# Patient Record
Sex: Male | Born: 1965 | Race: Black or African American | Hispanic: No | Marital: Single | Smoking: Never smoker
Health system: Southern US, Community
[De-identification: ages and names within clinical notes are randomized; demographics above are authoritative.]

---

## 2013-05-29 ENCOUNTER — Emergency Department (HOSPITAL_COMMUNITY)
Admission: EM | Admit: 2013-05-29 | Discharge: 2013-05-30 | Disposition: A | Discharge status: Home / Self Care | Payer: No Typology Code available for payment source | Attending: Emergency Medicine | Admitting: Emergency Medicine

## 2013-05-29 ENCOUNTER — Emergency Department (HOSPITAL_COMMUNITY): Payer: No Typology Code available for payment source

## 2013-05-29 ENCOUNTER — Encounter (HOSPITAL_COMMUNITY): Payer: Self-pay | Admitting: Emergency Medicine

## 2013-05-29 DIAGNOSIS — Y9389 Activity, other specified: Secondary | ICD-10-CM | POA: Insufficient documentation

## 2013-05-29 DIAGNOSIS — S61432A Puncture wound without foreign body of left hand, initial encounter: Secondary | ICD-10-CM

## 2013-05-29 DIAGNOSIS — Y92009 Unspecified place in unspecified non-institutional (private) residence as the place of occurrence of the external cause: Secondary | ICD-10-CM | POA: Insufficient documentation

## 2013-05-29 DIAGNOSIS — S61409A Unspecified open wound of unspecified hand, initial encounter: Secondary | ICD-10-CM | POA: Insufficient documentation

## 2013-05-29 DIAGNOSIS — W3400XA Accidental discharge from unspecified firearms or gun, initial encounter: Secondary | ICD-10-CM

## 2013-05-29 DIAGNOSIS — Z23 Encounter for immunization: Secondary | ICD-10-CM | POA: Insufficient documentation

## 2013-05-29 DIAGNOSIS — W320XXA Accidental handgun discharge, initial encounter: Secondary | ICD-10-CM | POA: Insufficient documentation

## 2013-05-29 MED ORDER — TETANUS-DIPHTH-ACELL PERTUSSIS 5-2.5-18.5 LF-MCG/0.5 IM SUSP
0.5000 mL | Freq: Once | INTRAMUSCULAR | Status: AC
  Administered 2013-05-29: 0.5 mL via INTRAMUSCULAR
  Filled 2013-05-29: qty 0.5

## 2013-05-29 MED ORDER — OXYCODONE-ACETAMINOPHEN 5-325 MG PO TABS
1.0000 | ORAL_TABLET | Freq: Once | ORAL | Status: AC
  Administered 2013-05-29: 1 via ORAL
  Filled 2013-05-29: qty 1

## 2013-05-29 NOTE — ED Notes (Signed)
PA at bedside.
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
## 2013-05-29 NOTE — ED Notes (Signed)
[employer] at bedside 
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
## 2013-05-30 MED ORDER — AMOXICILLIN-POT CLAVULANATE 875-125 MG PO TABS: 1.0000 | ORAL_TABLET | Freq: Two times a day (BID) | ORAL | Status: DC

## 2013-05-30 MED ORDER — AMOXICILLIN-POT CLAVULANATE 875-125 MG PO TABS
1.0000 | ORAL_TABLET | Freq: Once | ORAL | Status: AC
  Administered 2013-05-30: 1 via ORAL
  Filled 2013-05-30: qty 1

## 2013-05-30 MED ORDER — OXYCODONE-ACETAMINOPHEN 5-325 MG PO TABS: 1.0000 | ORAL_TABLET | ORAL | Status: DC | PRN
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
## 2013-05-30 NOTE — ED Notes (Signed)
Pt calling a ride home

## 2013-05-30 NOTE — ED Provider Notes (Signed)
Medical screening examination/treatment/procedure(s) were performed by non-physician practitioner and as supervising physician I was immediately available for consultation/collaboration.     Dorman Calderwood, MD 05/30/13 0611 

## 2014-08-05 ENCOUNTER — Ambulatory Visit: Payer: No Typology Code available for payment source | Admitting: Podiatry
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
## 2015-09-15 ENCOUNTER — Ambulatory Visit (INDEPENDENT_AMBULATORY_CARE_PROVIDER_SITE_OTHER): Payer: No Typology Code available for payment source | Admitting: Podiatry

## 2015-09-15 ENCOUNTER — Encounter: Payer: Self-pay | Admitting: Podiatry

## 2015-09-15 ENCOUNTER — Ambulatory Visit (INDEPENDENT_AMBULATORY_CARE_PROVIDER_SITE_OTHER): Payer: No Typology Code available for payment source

## 2015-09-15 VITALS — BP 125/82 | HR 84 | Resp 16 | Ht 74.0 in | Wt 245.0 lb

## 2015-09-15 DIAGNOSIS — B079 Viral wart, unspecified: Secondary | ICD-10-CM

## 2015-09-15 DIAGNOSIS — M2040 Other hammer toe(s) (acquired), unspecified foot: Secondary | ICD-10-CM

## 2015-09-15 DIAGNOSIS — M778 Other enthesopathies, not elsewhere classified: Secondary | ICD-10-CM

## 2015-09-15 DIAGNOSIS — Q6650 Congenital pes planus, unspecified foot: Secondary | ICD-10-CM

## 2015-09-15 DIAGNOSIS — M779 Enthesopathy, unspecified: Secondary | ICD-10-CM

## 2015-09-15 DIAGNOSIS — M7750 Other enthesopathy of unspecified foot: Secondary | ICD-10-CM

## 2015-09-15 DIAGNOSIS — Q828 Other specified congenital malformations of skin: Secondary | ICD-10-CM

## 2015-09-15 DIAGNOSIS — B353 Tinea pedis: Secondary | ICD-10-CM

## 2015-09-15 NOTE — Progress Notes (Signed)
   Subjective:    Patient ID: Jeremiah Gay, male    DOB: [birth year], 50 y.o.   MRN: [id number]  HPI: He presents today as a 50 year old male with a chief complaint of bilateral foot pain. He states that his feet have been hurting him for very long time and he develops painful calluses or possibly warts to the forefoot bilaterally. He states that he's been seen by a local podiatrist in the past who helped alleviate his symptoms at one time. He states that he has some burning in his feet particularly in the forefoot and dry scaly skin. He denies trauma.    Review of Systems  All other systems reviewed and are negative.      Objective:   Physical Exam: I have reviewed his past medical history medications allergy surgery social history and review of systems. He presents with a dry affect but in no apparent distress. Pulses are strongly palpable bilateral. Neurologic sensorium is intact per Semmes-Weinstein monofilament and vibratory sensation. Deep tendon reflexes are intact bilateral and muscle strength is intact bilateral. 5/5 strength in his dorsiflexors plantar flexors and inverters and everters. All intrinsic musculature appears to be intact digits are in good alignment with mild hammertoe deformities which appears to be flexible in nature. Orthopedic evaluation also demonstrates mild gastroc equinus bilateral with flexible pes planus. He has no reproducible pain other than pain beneath the second metatarsophalangeal joints bilaterally. Cutaneous evaluation demonstrates supple well-hydrated cutis reactive hyperkeratoses overlying the PIPJ's of the mildly contracted digits as well as painful reactive hyperkeratosis to the plantar aspect of the second and third metatarsophalangeal joints. Left foot appears to be worse than the right. I see no signs of warts and I see no scan sign of skin breakdown open lesions or wounds. Nail plates appear to be relatively normal but slightly thickened. Radiographs  demonstrate normal osseous architecture with pes planus and flexible hammertoe deformities.        Assessment & Plan:  Assessment: Hammertoe deformities bilateral. Pes planus bilateral. Porokeratosis/tyloma bilateral. Tinea pedis bilateral. Capsulitis of the second metatarsophalangeal joint bilateral.  Plan: We discussed the etiology pathology conservative versus surgical therapies. We discussed the possibility for the need of orthotics to help alleviate the symptoms of the forefoot bilaterally. At this point we deferred any injections. I debrided all reactive hyperkeratotic lesions and applied Cantharone under occlusion to be washed out thoroughly tomorrow. He understands this is amenable to it and will follow up with me in a few weeks. He also provided me with paperwork to fill out for [employer] disability.

## 2015-09-17 DIAGNOSIS — M2040 Other hammer toe(s) (acquired), unspecified foot: Secondary | ICD-10-CM

## 2015-10-06 ENCOUNTER — Encounter: Payer: Self-pay | Admitting: Podiatry

## 2015-10-06 ENCOUNTER — Ambulatory Visit (INDEPENDENT_AMBULATORY_CARE_PROVIDER_SITE_OTHER): Payer: No Typology Code available for payment source | Admitting: Podiatry

## 2015-10-06 VITALS — BP 145/103 | HR 88 | Resp 16

## 2015-10-06 DIAGNOSIS — Q828 Other specified congenital malformations of skin: Secondary | ICD-10-CM

## 2015-10-06 DIAGNOSIS — B070 Plantar wart: Secondary | ICD-10-CM

## 2015-10-06 NOTE — Progress Notes (Signed)
He presents today for follow-up of his painful forefoot bilaterally. He states the calluses are starting to return it hurt again and the medication we placed on the last time he can fill a lot better.  Objective: Vital signs stable alert and oriented 3. Pulses are palpable. Recurrence of porokeratosis and nephew's tyloma plantar aspect of the bilateral forefoot no open lesions or wounds. Much better than last visit.  Assessment: Well-healing calluses and forefoot pain associated with tyloma is a porokeratosis.  Plan: Sharp debridement of these lesions today with chemical debridement under occlusion. We used salicylic acid this time we may need to use Cantharone next time. We need to see about [place]

## 2015-11-05 ENCOUNTER — Ambulatory Visit: Payer: No Typology Code available for payment source | Admitting: Podiatry

## 2015-11-19 ENCOUNTER — Ambulatory Visit (INDEPENDENT_AMBULATORY_CARE_PROVIDER_SITE_OTHER): Payer: No Typology Code available for payment source | Admitting: Podiatry

## 2015-11-19 ENCOUNTER — Encounter: Payer: Self-pay | Admitting: Podiatry

## 2015-11-19 DIAGNOSIS — Q828 Other specified congenital malformations of skin: Secondary | ICD-10-CM

## 2015-11-19 DIAGNOSIS — Q6650 Congenital pes planus, unspecified foot: Secondary | ICD-10-CM

## 2015-11-19 NOTE — Progress Notes (Signed)
He presents today complaining of painful bilateral forefoot.  Objective: Pulses are strongly palpable. Multiple callosities to the forefoot bilateral. Pulses remain palpable no open wounds or lesions.  Assessment: Porokeratosis tyloma forefoot bilateral.  Plan: At this point I debrided the reactive hyperkeratosis and placed Cantharone under occlusion for 2 days he will follow up with me in 1 month we will evaluate whether or not his insurance will cover his orthotics.

## 2016-03-17 ENCOUNTER — Telehealth (INDEPENDENT_AMBULATORY_CARE_PROVIDER_SITE_OTHER): Payer: Self-pay | Admitting: Orthopaedic Surgery

## 2016-03-23 ENCOUNTER — Ambulatory Visit (INDEPENDENT_AMBULATORY_CARE_PROVIDER_SITE_OTHER): Payer: No Typology Code available for payment source | Admitting: Physician Assistant

## 2016-03-25 NOTE — Telephone Encounter (Signed)
Error

## 2016-05-26 ENCOUNTER — Ambulatory Visit (INDEPENDENT_AMBULATORY_CARE_PROVIDER_SITE_OTHER): Payer: No Typology Code available for payment source | Admitting: Orthopaedic Surgery

## 2016-05-26 ENCOUNTER — Encounter (INDEPENDENT_AMBULATORY_CARE_PROVIDER_SITE_OTHER): Payer: Self-pay

## 2016-05-26 DIAGNOSIS — M25561 Pain in right knee: Secondary | ICD-10-CM | POA: Diagnosis not present

## 2016-05-26 DIAGNOSIS — G8929 Other chronic pain: Secondary | ICD-10-CM | POA: Diagnosis not present

## 2016-05-26 MED ORDER — LIDOCAINE HCL 1 % IJ SOLN
3.0000 mL | INTRAMUSCULAR | Status: AC | PRN
  Administered 2016-05-26: 3 mL

## 2016-05-26 MED ORDER — METHYLPREDNISOLONE ACETATE 40 MG/ML IJ SUSP
40.0000 mg | INTRAMUSCULAR | Status: AC | PRN
  Administered 2016-05-26: 40 mg via INTRA_ARTICULAR

## 2016-05-26 NOTE — Progress Notes (Signed)
   Office Visit Note   Patient: Jeremiah Gay           Date of Birth: 1965-08-01           MRN: [id number] Visit Date: 05/26/2016              Requested by: No referring provider defined for this encounter. PCP: No PCP Per Patient   Assessment & Plan: Visit Diagnoses:  1. Chronic pain of right knee     Plan: He tolerated the aspiration and injection of a steroid well on his knee. I got additional 60-80 mL of fluid out of his knee. This is a recurrent synovitis. My only other recommendation would be an arthroscopic intervention at some point to perform a synovectomy and debridement of the knee to hopefully keep the effusions from recurring. He'll let us know if he like to have this done in the future.  Follow-Up Instructions: Return if symptoms worsen or fail to improve.   Orders:  Orders Placed This Encounter  Procedures  . Large Joint Injection/Arthrocentesis   No orders of the defined types were placed in this encounter.     Procedures: Large Joint Inj Date/Time: 05/26/2016 1:28 PM Performed by: Kathryne HitchBLACKMAN, Jeremiah Gay Authorized by: Kathryne HitchBLACKMAN, Jeremiah Gay   Location:  Knee Site:  R knee Ultrasound Guidance: No   Fluoroscopic Guidance: No   Arthrogram: No   Medications:  3 mL lidocaine 1 %; 40 mg methylPREDNISolone acetate 40 MG/ML     Clinical Data: No additional findings.   Subjective: Chief Complaint  Patient presents with  . Right Knee - Pain    States he is having knee pain and swelling. Wants knee drained today    HPI The patient is gentleman that I see time to time for recurrent right knee effusion. We have recommended arthroscopic intervention but he still is issued in just having the knee drained. He says it started swelling again on him recently. His is been since August 2017 that I drained it last. Is deathly bothering him he says. Review of Systems He denies any fever and chills or nausea and vomiting. Denies any chest pain shortness of  breath or headache.  Objective: Vital Signs: There were no vitals taken for this visit.  Physical Exam He is alert and oriented 3. Ortho Exam In addition of his right knee shows a very large effusion. It's painful range of motion. Is not red but it is warm. The knee feels ligamentously a stable. Specialty Comments:  No specialty comments available.  Imaging: No results found.   PMFS History: There are no active problems to display for this patient.  No past medical history on file.  No family history on file.  No past surgical history on file. Social History   Occupational History  . Not on file.   Social History Main Topics  . Smoking status: Never Smoker  . Smokeless tobacco: Not on file  . Alcohol use No  . Drug use: No  . Sexual activity: Not on file

## 2016-08-29 ENCOUNTER — Encounter (INDEPENDENT_AMBULATORY_CARE_PROVIDER_SITE_OTHER): Payer: Self-pay

## 2016-08-29 ENCOUNTER — Encounter (INDEPENDENT_AMBULATORY_CARE_PROVIDER_SITE_OTHER): Payer: Self-pay | Admitting: Physician Assistant

## 2016-08-29 ENCOUNTER — Ambulatory Visit (INDEPENDENT_AMBULATORY_CARE_PROVIDER_SITE_OTHER): Payer: No Typology Code available for payment source | Admitting: Physician Assistant

## 2016-08-29 DIAGNOSIS — M25562 Pain in left knee: Secondary | ICD-10-CM

## 2016-08-29 DIAGNOSIS — G8929 Other chronic pain: Secondary | ICD-10-CM | POA: Diagnosis not present

## 2016-08-29 MED ORDER — LIDOCAINE HCL 1 % IJ SOLN
1.0000 mL | INTRAMUSCULAR | Status: AC | PRN
  Administered 2016-08-29: 1 mL

## 2016-08-29 MED ORDER — METHYLPREDNISOLONE ACETATE 40 MG/ML IJ SUSP
40.0000 mg | INTRAMUSCULAR | Status: AC | PRN
  Administered 2016-08-29: 40 mg via INTRA_ARTICULAR

## 2016-08-29 NOTE — Progress Notes (Signed)
   Office Visit Note   Patient: Jeremiah Gay           Date of Birth: 1965-07-27           MRN: [id number] Visit Date: 08/29/2016              Requested by: No referring provider defined for this encounter. PCP: No PCP Per Patient   Assessment & Plan: Visit Diagnoses: No diagnosis found.  Plan: Offered as an injection of cortisone in the knee today he defers. Therefore aspiration of the knee is performed after 2 mL lidocaine was used to anesthetize the knee after the prep with Betadine and ethyl chloride. Empty cc of blood-tinged aspirate was obtained. Patient tolerates well.  Follow-Up Instructions: Return if symptoms worsen or fail to improve.   Orders:  No orders of the defined types were placed in this encounter.  No orders of the defined types were placed in this encounter.     Procedures: Large Joint Inj Date/Time: 08/29/2016 2:49 PM Performed by: Kirtland Bouchard Authorized by: Kirtland Bouchard   Consent Given by:  Patient Indications:  Pain Location:  Knee Site:  R knee Needle Size:  22 G Approach:  Superolateral Ultrasound Guidance: No   Fluoroscopic Guidance: No   Medications:  1 mL lidocaine 1 %; 40 mg methylPREDNISolone acetate 40 MG/ML Aspiration Attempted: Yes   Aspirate amount (mL):  50 Aspirate:  Blood-tinged Patient tolerance:  Patient tolerated the procedure well with no immediate complications     Clinical Data: No additional findings.   Subjective: Chief Complaint  Patient presents with  . Right Knee - Pain    HPI Jeremiah Gay's well-known [facility] comes in today asking for the fluid be trading off his knee. He had aspiration injection back in January. He says it helped for a while fluid just comes back. He is taking some ibuprofen which helps some. We recommended a knee arthroscopy in the past that he does not want to proceed with this at this point time. He's had no new injury  Review of Systems No fevers chills  shortness breath chest pain  Objective: Vital Signs: There were no vitals taken for this visit.  Physical Exam  Constitutional: He appears well-developed and well-nourished. No distress.    Ortho Exam Right knee no abnormal warmth positive effusion. He has overall good range of motion of the knee.  Specialty Comments:  No specialty comments available.  Imaging: No results found.   PMFS History: There are no active problems to display for this patient.  No past medical history on file.  No family history on file.  No past surgical history on file. Social History   Occupational History  . Not on file.   Social History Main Topics  . Smoking status: Never Smoker  . Smokeless tobacco: Never Used  . Alcohol use No  . Drug use: No  . Sexual activity: Not on file

## 2018-04-03 ENCOUNTER — Telehealth (INDEPENDENT_AMBULATORY_CARE_PROVIDER_SITE_OTHER): Payer: Self-pay | Admitting: Orthopaedic Surgery

## 2018-04-03 NOTE — Telephone Encounter (Signed)
I received vm from patient wanting copy of records. I called him back and lmvm advised that we do need signed release form and for him to call me back if I need to mail him the form

## 2018-04-05 ENCOUNTER — Telehealth (INDEPENDENT_AMBULATORY_CARE_PROVIDER_SITE_OTHER): Payer: Self-pay | Admitting: Orthopaedic Surgery

## 2018-04-05 NOTE — Telephone Encounter (Signed)
Patient called today lmvm stating needing to get copy of records today. I have copy of records a front desk, IC,lmam advised ready to pickup and need to sign release form

## 2019-01-28 ENCOUNTER — Emergency Department (INDEPENDENT_AMBULATORY_CARE_PROVIDER_SITE_OTHER)
Admission: EM | Admit: 2019-01-28 | Discharge: 2019-01-28 | Disposition: A | Discharge status: Home / Self Care | Payer: No Typology Code available for payment source | Source: Home / Self Care

## 2019-01-28 ENCOUNTER — Other Ambulatory Visit: Payer: Self-pay

## 2019-01-28 ENCOUNTER — Encounter: Payer: Self-pay | Admitting: Emergency Medicine

## 2019-01-28 DIAGNOSIS — S01511A Laceration without foreign body of lip, initial encounter: Secondary | ICD-10-CM | POA: Diagnosis not present

## 2019-01-28 DIAGNOSIS — Z23 Encounter for immunization: Secondary | ICD-10-CM | POA: Diagnosis not present

## 2019-01-28 MED ORDER — TETANUS-DIPHTH-ACELL PERTUSSIS 5-2.5-18.5 LF-MCG/0.5 IM SUSP
0.5000 mL | Freq: Once | INTRAMUSCULAR | Status: AC
  Administered 2019-01-28: 12:00:00 0.5 mL via INTRAMUSCULAR

## 2019-01-28 NOTE — Discharge Instructions (Signed)
°  You had a total of 6 absorbably sutures placed today, 4 on the inside and 2 on the outside. They should slowly come out on their own over the last 1 week.   You may gently wash your mouth/lip using warm water and salt mixture twice daily. To help with swelling you may apply a cool compress over a thin washcloth to help prevent your skin from becoming too cold.  Please follow up in 5-7 days if concern wound is not healing or sutures are becoming bothersome.  Please follow up sooner if concern for infection- increased pain, swelling, drainage of pus, fever.

## 2019-01-28 NOTE — ED Provider Notes (Signed)
[facility]    CSN: [id number] Arrival date & time: 01/28/19  1053      History   Chief Complaint Chief Complaint  Patient presents with  . Lip Laceration    HPI Jeremiah Gay is a 53 y.o. male.   HPI  Jeremiah Gay is a 53 y.o. male presenting to UC with c/o laceration of through his lower lip that occurred this morning. Pt has Parkinson's and uncontrollable tremors at times, resulting in him accidentally biting through his lower lip.  Bleeding controlled PTA but pain is most bothersome, 6/10.  He believes he may need sutures. He is not on blood thinners.    History reviewed. No pertinent past medical history.  There are no active problems to display for this patient.   History reviewed. No pertinent surgical history.     Home Medications    Prior to Admission medications   Medication Sig Start Date End Date Taking? Authorizing Provider  carbidopa-levodopa (SINEMET IR) 25-100 MG tablet Take by mouth. 05/06/16   [provider]  ibuprofen (ADVIL,MOTRIN) 800 MG tablet Take 800 mg by mouth every 8 (eight) hours as needed for moderate pain.     [provider]  methocarbamol (ROBAXIN) 500 MG tablet Take by mouth.    [provider]  propranolol (INDERAL) 20 MG tablet  09/09/15   [provider]  UNABLE TO FIND Take 1 tablet by mouth daily. Wild Yams for tremors    [provider]    Family History History reviewed. No pertinent family history.  Social History Social History   Tobacco Use  . Smoking status: Never Smoker  . Smokeless tobacco: Never Used  Substance Use Topics  . Alcohol use: No  . Drug use: No     Allergies   Patient has no known allergies.   Review of Systems Review of Systems  Skin: Positive for wound. Negative for color change.  Neurological: Positive for tremors (chronic). Negative for weakness.     Physical Exam Triage Vital Signs ED Triage Vitals  Enc Vitals Group      BP 01/28/19 1139 138/85     Pulse Rate 01/28/19 1139 99     Resp --      Temp 01/28/19 1139 98.6 F (37 C)     Temp Source 01/28/19 1139 Oral     SpO2 01/28/19 1139 100 %     Weight 01/28/19 1140 226 lb (102.5 kg)     Height 01/28/19 1140 6\' 2"  (1.88 m)     Head Circumference --      Peak Flow --      Pain Score 01/28/19 1140 6     Pain Loc --      Pain Edu? --      Excl. in [place]? --    No data found.  Updated Vital Signs BP 138/85 (BP Location: Left Arm)   Pulse 99   Temp 98.6 F (37 C) (Oral)   Ht 6\' 2"  (1.88 m)   Wt 226 lb (102.5 kg)   SpO2 100%   BMI 29.02 kg/m     Physical Exam Vitals signs and nursing note reviewed.  Constitutional:      Appearance: Normal appearance. He is well-developed.  HENT:     Head: Normocephalic.     Mouth/Throat:     Lips: Pink.     Mouth: Mucous membranes are moist. Injury and lacerations present.     Dentition: Normal dentition ( front teeth stable  and in tact. ). No dental tenderness.   Neck:     Musculoskeletal: Normal range of motion.  Cardiovascular:     Rate and Rhythm: Normal rate.  Pulmonary:     Effort: Pulmonary effort is normal.  Musculoskeletal: Normal range of motion.  Skin:    General: Skin is warm and dry.  Neurological:     Mental Status: He is alert and oriented to person, place, and time.     Comments: Essential tremor noted  Psychiatric:        Behavior: Behavior normal.      UC Treatments / Results  Labs (all labs ordered are listed, but only abnormal results are displayed) Labs Reviewed - No data to display  EKG   Radiology No results found.  Procedures Laceration Repair  Date/Time: 01/28/2019 12:18 PM Performed by: Noe Gens, PA-C Authorized by: Noe Gens, PA-C   Consent:    Consent obtained:  Verbal   Consent given by:  Patient   Risks discussed:  Infection, poor cosmetic result, poor wound healing, pain and need for additional repair   Alternatives discussed:  Delayed  treatment and no treatment Anesthesia (see MAR for exact dosages):    Anesthesia method:  Local infiltration   Local anesthetic:  Lidocaine 1% WITH epi Laceration details:    Location:  Lip   Lip location:  Lower lip, full thickness   Vermilion border involved: no     Wound length (cm): 2cm on inside, 1cm on outside/top of lip. Repair type:    Repair type:  Simple Pre-procedure details:    Preparation:  Patient was prepped and draped in usual sterile fashion Exploration:    Hemostasis achieved with:  Direct pressure   Wound exploration: wound explored through full range of motion and entire depth of wound probed and visualized     Wound extent: areolar tissue violated     Wound extent: no fascia violation noted, no foreign bodies/material noted, no muscle damage noted, no nerve damage noted, no tendon damage noted, no underlying fracture noted and no vascular damage noted     Contaminated: no   Treatment:    Area cleansed with:  Saline   Amount of cleaning:  Standard Skin repair:    Repair method:  Sutures   Suture size:  4-0   Suture material:  Fast-absorbing gut   Suture technique:  Simple interrupted   Number of sutures: 4 on inside, 2 out outside/top of lip. Approximation:    Approximation:  Close   Vermilion border: well-aligned   Post-procedure details:    Dressing:  Open (no dressing)   Patient tolerance of procedure:  Tolerated well, no immediate complications   (including critical care time)  Medications Ordered in UC Medications  Tdap (BOOSTRIX) injection 0.5 mL (0.5 mLs Intramuscular Given 01/28/19 1215)    Initial Impression / Assessment and Plan / UC Course  I have reviewed the triage vital signs and the nursing notes.  Pertinent labs & imaging results that were available during my care of the patient were reviewed by me and considered in my medical decision making (see chart for details).     Laceration through lower lip, repaired as noted above Tdap  updated. Pt usually goes to the [place], advised he may return to UC for wound recheck if needed. AVS provided.  Final Clinical Impressions(s) / UC Diagnoses   Final diagnoses:  Laceration of lower lip, initial encounter     Discharge Instructions  You had a total of 6 absorbably sutures placed today, 4 on the inside and 2 on the outside. They should slowly come out on their own over the last 1 week.   You may gently wash your mouth/lip using warm water and salt mixture twice daily. To help with swelling you may apply a cool compress over a thin washcloth to help prevent your skin from becoming too cold.  Please follow up in 5-7 days if concern wound is not healing or sutures are becoming bothersome.  Please follow up sooner if concern for infection- increased pain, swelling, drainage of pus, fever.     ED Prescriptions    None     Controlled Substance Prescriptions [place] Controlled Substance Registry consulted? Not Applicable   Rolla Platehelps, Zoria Rawlinson O, PA-C 01/28/19 1222

## 2019-01-28 NOTE — ED Triage Notes (Signed)
Patient bite bottom lip this morning, painful

## 2019-01-30 ENCOUNTER — Emergency Department (INDEPENDENT_AMBULATORY_CARE_PROVIDER_SITE_OTHER)
Admission: EM | Admit: 2019-01-30 | Discharge: 2019-01-30 | Disposition: A | Discharge status: Home / Self Care | Payer: No Typology Code available for payment source | Source: Home / Self Care

## 2019-01-30 ENCOUNTER — Encounter: Payer: Self-pay | Admitting: Family Medicine

## 2019-01-30 ENCOUNTER — Other Ambulatory Visit: Payer: Self-pay

## 2019-01-30 DIAGNOSIS — L929 Granulomatous disorder of the skin and subcutaneous tissue, unspecified: Secondary | ICD-10-CM

## 2019-01-30 MED ORDER — CHLORHEXIDINE GLUCONATE 0.12 % MT SOLN: 15.0000 mL | Freq: Two times a day (BID) | OROMUCOSAL | 0 refills | Status: AC

## 2019-01-30 NOTE — ED Triage Notes (Signed)
Pt here for follow up on lip laceration

## 2019-01-30 NOTE — Discharge Instructions (Addendum)
Gently swish the Peridex (chlorhexidine gluconate) twice a day to cleanse the laceration site.  It is okay to use dilute hydrogen peroxide (50-50 with water) or your Listerine to help clean off the laceration area.  This will not damage the stitches.  Plan on coming back either Friday or Saturday.

## 2019-01-30 NOTE — ED Provider Notes (Signed)
[facility]    CSN: [id number] Arrival date & time: 01/30/19  1829      History   Chief Complaint Chief Complaint  Patient presents with  . Follow-up    HPI Jeremiah Gay is a 53 y.o. male.   53 yo man had absorbable sutures (#6) placed in lower lip 2 days ago here.  He is concerned because of the yellow thick eschar covering the sutures.  No fever or new lip swelling     History reviewed. No pertinent past medical history.  There are no active problems to display for this patient.   History reviewed. No pertinent surgical history.     Home Medications    Prior to Admission medications   Medication Sig Start Date End Date Taking? Authorizing Provider  carbidopa-levodopa (SINEMET IR) 25-100 MG tablet Take by mouth. 05/06/16   [provider]  chlorhexidine (PERIDEX) 0.12 % solution Use as directed 15 mLs in the mouth or throat 2 (two) times daily. 01/30/19   Robyn Haber, MD  ibuprofen (ADVIL,MOTRIN) 800 MG tablet Take 800 mg by mouth every 8 (eight) hours as needed for moderate pain.     [provider]  methocarbamol (ROBAXIN) 500 MG tablet Take by mouth.    [provider]  propranolol (INDERAL) 20 MG tablet  09/09/15   [provider]  UNABLE TO FIND Take 1 tablet by mouth daily. Wild Yams for tremors    [provider]    Family History History reviewed. No pertinent family history.  Social History Social History   Tobacco Use  . Smoking status: Never Smoker  . Smokeless tobacco: Never Used  Substance Use Topics  . Alcohol use: No  . Drug use: No     Allergies   Patient has no known allergies.   Review of Systems Review of Systems   Physical Exam Triage Vital Signs ED Triage Vitals  Enc Vitals Group     BP      Pulse      Resp      Temp      Temp src      SpO2      Weight      Height      Head Circumference      Peak Flow      Pain Score      Pain Loc      Pain Edu?       Excl. in [place]?    No data found.  Updated Vital Signs BP (!) 141/89 (BP Location: Left Arm)   Pulse 90   Temp 98.5 F (36.9 C) (Oral)   Resp 20   Ht 6\' 2"  (1.88 m)   Wt 100.7 kg   SpO2 98%   BMI 28.50 kg/m    Physical Exam Vitals signs and nursing note reviewed.  Constitutional:      Appearance: Normal appearance.  HENT:     Mouth/Throat:     Comments: Thick yellow eschar over sutures.  The lip shows no induration or cellulitis. Eyes:     Conjunctiva/sclera: Conjunctivae normal.  Skin:    General: Skin is warm.  Neurological:     Mental Status: He is alert.      UC Treatments / Results  Labs (all labs ordered are listed, but only abnormal results are displayed) Labs Reviewed - No data to display  EKG   Radiology No results found.  Procedures Procedures (including critical care time)  Medications Ordered  in UC Medications - No data to display  Initial Impression / Assessment and Plan / UC Course  I have reviewed the triage vital signs and the nursing notes.  Pertinent labs & imaging results that were available during my care of the patient were reviewed by me and considered in my medical decision making (see chart for details).    Final Clinical Impressions(s) / UC Diagnoses   Final diagnoses:  Granulation tissue following laceration     Discharge Instructions     Gently swish the Peridex (chlorhexidine gluconate) twice a day to cleanse the laceration site.  It is okay to use dilute hydrogen peroxide (50-50 with water) or your Listerine to help clean off the laceration area.  This will not damage the stitches.  Plan on coming back either Friday or Saturday.    ED Prescriptions    Medication Sig Dispense Auth. Provider   chlorhexidine (PERIDEX) 0.12 % solution Use as directed 15 mLs in the mouth or throat 2 (two) times daily. 120 mL Elvina SidleLauenstein, Onedia Vargus, MD     Controlled Substance Prescriptions [place] Controlled Substance Registry consulted?  Not Applicable   Elvina SidleLauenstein, Beckett Hickmon, MD 01/30/19 1925

## 2019-02-05 ENCOUNTER — Other Ambulatory Visit: Payer: Self-pay

## 2019-02-05 ENCOUNTER — Emergency Department (INDEPENDENT_AMBULATORY_CARE_PROVIDER_SITE_OTHER)
Admission: EM | Admit: 2019-02-05 | Discharge: 2019-02-05 | Disposition: A | Discharge status: Home / Self Care | Payer: No Typology Code available for payment source | Source: Home / Self Care

## 2019-02-05 DIAGNOSIS — Z5189 Encounter for other specified aftercare: Secondary | ICD-10-CM

## 2019-02-05 NOTE — Discharge Instructions (Signed)
°  Be sure to use the prescribed antibiotic mouth rinse or you can make a mouth rinse at home with 50% (half) tap water and 50% (half) peroxide, swish for 30-60 seconds 2-3 times daily to help with healing.

## 2019-02-05 NOTE — ED Provider Notes (Signed)
[facility]    CSN: [id number] Arrival date & time: 02/05/19  1604      History   Chief Complaint Chief Complaint  Patient presents with  . Wound Check    Lip    HPI Jeremiah Gay is a 53 y.o. male.   HPI Jeremiah Gay is a 53 y.o. male presenting to UC for a wound check of his lower lip. Pt was seen initially on 9/7 after biting his lip due to tremors/muscle contractions secondary to his Parkinson's disease.  Wound was sutured closed using absorbable sutures. Pt seen again on 9/9, prescribed chlorhexidine gluconate and advised to use peroxide/water (50/50%) mouth rinse. He has been using OTC Listerine instead. Pt concerned about bad breath. He reports mild improvement of laceration inside his mouth.  No fever, chills.   History reviewed. No pertinent past medical history.  There are no active problems to display for this patient.   History reviewed. No pertinent surgical history.     Home Medications    Prior to Admission medications   Medication Sig Start Date End Date Taking? Authorizing Provider  carbidopa-levodopa (SINEMET IR) 25-100 MG tablet Take by mouth. 05/06/16   [provider]  chlorhexidine (PERIDEX) 0.12 % solution Use as directed 15 mLs in the mouth or throat 2 (two) times daily. 01/30/19   Robyn Haber, MD  ibuprofen (ADVIL,MOTRIN) 800 MG tablet Take 800 mg by mouth every 8 (eight) hours as needed for moderate pain.     [provider]  methocarbamol (ROBAXIN) 500 MG tablet Take by mouth.    [provider]  propranolol (INDERAL) 20 MG tablet  09/09/15   [provider]  UNABLE TO FIND Take 1 tablet by mouth daily. Wild Yams for tremors    [provider]    Family History History reviewed. No pertinent family history.  Social History Social History   Tobacco Use  . Smoking status: Never Smoker  . Smokeless tobacco: Never Used  Substance Use Topics  . Alcohol use: No  . Drug  use: No     Allergies   Patient has no known allergies.   Review of Systems Review of Systems  Skin: Positive for wound. Negative for color change.     Physical Exam Triage Vital Signs ED Triage Vitals  Enc Vitals Group     BP      Pulse      Resp      Temp      Temp src      SpO2      Weight      Height      Head Circumference      Peak Flow      Pain Score      Pain Loc      Pain Edu?      Excl. in [place]?    No data found.  Updated Vital Signs BP 134/80 (BP Location: Right Arm)   Pulse 92   Temp 98.3 F (36.8 C) (Oral)   Resp 18   Ht 6\' 2"  (1.88 m)   Wt 222 lb 10.6 oz (101 kg)   SpO2 97%   BMI 28.59 kg/m   Visual Acuity Right Eye Distance:   Left Eye Distance:   Bilateral Distance:    Right Eye Near:   Left Eye Near:    Bilateral Near:     Physical Exam Vitals signs and nursing note reviewed.  Constitutional:      Appearance:  Normal appearance.  HENT:     Head: Normocephalic.     Mouth/Throat:     Mouth: Mucous membranes are moist.     Comments: Lower lip: laceration on external lip appears nearly completely healed. No signs of infection. Laceration inside lip/buccal mucosa side- granulation tissue present. Mild edema. Sutures still in place.  Eyes:     Extraocular Movements: Extraocular movements intact.  Cardiovascular:     Rate and Rhythm: Normal rate.  Pulmonary:     Effort: Pulmonary effort is normal.  Skin:    General: Skin is warm and dry.  Neurological:     Mental Status: He is alert.      UC Treatments / Results  Labs (all labs ordered are listed, but only abnormal results are displayed) Labs Reviewed - No data to display  EKG   Radiology No results found.  Procedures Procedures (including critical care time)  Medications Ordered in UC Medications - No data to display  Initial Impression / Assessment and Plan / UC Course  I have reviewed the triage vital signs and the nursing notes.  Pertinent labs & imaging  results that were available during my care of the patient were reviewed by me and considered in my medical decision making (see chart for details).     Wounds appear to be healing well, outside moreso than inside. Encouraged pt to stop using Listerine and start using the prescribed mouthwash or the 50/50% water/peroxide mixture at home. F/u later this week if needed.  Final Clinical Impressions(s) / UC Diagnoses   Final diagnoses:  Visit for wound check     Discharge Instructions      Be sure to use the prescribed antibiotic mouth rinse or you can make a mouth rinse at home with 50% (half) tap water and 50% (half) peroxide, swish for 30-60 seconds 2-3 times daily to help with healing.     ED Prescriptions    None     Controlled Substance Prescriptions [place] Controlled Substance Registry consulted? Not Applicable   Rolla Platehelps, Solon Alban O, PA-C 02/05/19 1644

## 2019-02-05 NOTE — ED Triage Notes (Signed)
Pt here today for follow up to check on lip stitches. Per Dr Joseph Art, he was to return 2-3 after last visit.
